# Patient Record
Sex: Female | Born: 1965
Health system: Southern US, Community
[De-identification: ages and names within clinical notes are randomized; demographics above are authoritative.]

---

## 2017-10-16 DIAGNOSIS — Z91038 Other insect allergy status: Secondary | ICD-10-CM | POA: Diagnosis not present

## 2017-12-08 ENCOUNTER — Emergency Department
Admission: EM | Admit: 2017-12-08 | Discharge: 2017-12-08 | Disposition: A | Discharge status: Home / Self Care | Payer: BLUE CROSS/BLUE SHIELD | Attending: Emergency Medicine | Admitting: Emergency Medicine

## 2017-12-08 ENCOUNTER — Encounter: Payer: Self-pay | Admitting: Emergency Medicine

## 2017-12-08 ENCOUNTER — Emergency Department: Payer: BLUE CROSS/BLUE SHIELD

## 2017-12-08 ENCOUNTER — Other Ambulatory Visit: Payer: Self-pay

## 2017-12-08 DIAGNOSIS — M25511 Pain in right shoulder: Secondary | ICD-10-CM | POA: Diagnosis not present

## 2017-12-08 DIAGNOSIS — M75101 Unspecified rotator cuff tear or rupture of right shoulder, not specified as traumatic: Secondary | ICD-10-CM | POA: Diagnosis not present

## 2017-12-08 DIAGNOSIS — R0789 Other chest pain: Secondary | ICD-10-CM | POA: Diagnosis not present

## 2017-12-08 DIAGNOSIS — R079 Chest pain, unspecified: Secondary | ICD-10-CM | POA: Diagnosis not present

## 2017-12-08 LAB — BASIC METABOLIC PANEL WITH GFR
Anion gap: 8 (ref 5–15)
BUN: 18 mg/dL (ref 6–20)
CO2: 25 mmol/L (ref 22–32)
Calcium: 9.2 mg/dL (ref 8.9–10.3)
Chloride: 108 mmol/L (ref 98–111)
Creatinine, Ser: 0.9 mg/dL (ref 0.44–1.00)
GFR calc Af Amer: >60 mL/min
GFR calc non Af Amer: >60 mL/min
Glucose, Bld: 123 mg/dL — ABNORMAL HIGH (ref 70–99)
Potassium: 3.6 mmol/L (ref 3.5–5.1)
Sodium: 141 mmol/L (ref 135–145)

## 2017-12-08 LAB — CBC WITH DIFFERENTIAL/PLATELET
Basophils Absolute: 0 K/uL (ref 0–0.1)
Basophils Relative: 1 %
Eosinophils Absolute: 0.1 K/uL (ref 0–0.7)
Eosinophils Relative: 2 %
HCT: 36.8 % (ref 35.0–47.0)
Hemoglobin: 12.4 g/dL (ref 12.0–16.0)
Lymphocytes Relative: 27 %
Lymphs Abs: 1 K/uL (ref 1.0–3.6)
MCH: 30.1 pg (ref 26.0–34.0)
MCHC: 33.6 g/dL (ref 32.0–36.0)
MCV: 89.7 fL (ref 80.0–100.0)
Monocytes Absolute: 0.4 K/uL (ref 0.2–0.9)
Monocytes Relative: 11 %
Neutro Abs: 2.3 K/uL (ref 1.4–6.5)
Neutrophils Relative %: 59 %
Platelets: 215 K/uL (ref 150–440)
RBC: 4.11 MIL/uL (ref 3.80–5.20)
RDW: 14.1 % (ref 11.5–14.5)
WBC: 3.9 K/uL (ref 3.6–11.0)

## 2017-12-08 LAB — TROPONIN I

## 2017-12-08 MED ORDER — HYDROCODONE-ACETAMINOPHEN 5-325 MG PO TABS: 1.0000 | ORAL_TABLET | ORAL | 0 refills | Status: DC | PRN

## 2017-12-08 MED ORDER — BUPIVACAINE HCL 0.5 % IJ SOLN
3.0000 mL | Freq: Once | INTRAMUSCULAR | Status: AC
  Administered 2017-12-08: 3 mL
  Filled 2017-12-08 (×2): qty 3

## 2017-12-08 MED ORDER — HYDROCODONE-ACETAMINOPHEN 5-325 MG PO TABS
1.0000 | ORAL_TABLET | ORAL | Status: AC
  Administered 2017-12-08: 1 via ORAL
  Filled 2017-12-08: qty 1

## 2017-12-08 MED ORDER — MELOXICAM 15 MG PO TABS: 15.0000 mg | ORAL_TABLET | Freq: Every day | ORAL | 2 refills | Status: AC

## 2017-12-08 MED ORDER — LIDOCAINE HCL (PF) 1 % IJ SOLN
3.0000 mL | Freq: Once | INTRAMUSCULAR | Status: AC
  Administered 2017-12-08: 3 mL
  Filled 2017-12-08: qty 5

## 2017-12-08 MED ORDER — TRIAMCINOLONE ACETONIDE 40 MG/ML IJ SUSP
80.0000 mg | Freq: Once | INTRAMUSCULAR | Status: AC
  Administered 2017-12-08: 80 mg via INTRA_ARTICULAR
  Filled 2017-12-08: qty 2

## 2017-12-08 NOTE — ED Provider Notes (Signed)
[facility] EMERGENCY DEPARTMENT Provider Note   CSN: [id number] Arrival date & time: 12/08/17  2135     History   Chief Complaint Chief Complaint  Patient presents with  . Shoulder Pain    HPI Kendra Reyes is a 52 y.o. female.  Presents to the emergency department for evaluation of right shoulder pain.  Shoulder pain is been present since yesterday morning.  Patient states she woke up Friday morning with aching pain in the right shoulder with no preceding trauma or injury.  She denies any history of shoulder pain.  She has taken ibuprofen 400 mg twice daily over the last 2 days with very mild relief.  Pain is 9 out of 10 increased with shoulder range of motion.  She denies any numbness or tingling right upper extremity.  No pain with neck range of motion.  No chest pain shortness of breath, weakness nausea vomiting diaphoresis.  HPI  History reviewed. No pertinent past medical history.  There are no active problems to display for this patient.   History reviewed. No pertinent surgical history.   OB History   None      Home Medications    Prior to Admission medications   Medication Sig Start Date End Date Taking? Authorizing Provider  HYDROcodone-acetaminophen (NORCO) 5-325 MG tablet Take 1 tablet by mouth every 4 (four) hours as needed for moderate pain. 12/08/17   Evon Slack, PA-C  meloxicam (MOBIC) 15 MG tablet Take 1 tablet (15 mg total) by mouth daily. 12/08/17 12/08/18  Evon Slack, PA-C    Family History No family history on file.  Social History Social History   Tobacco Use  . Smoking status: Never Smoker  . Smokeless tobacco: Never Used  Substance Use Topics  . Alcohol use: Never    Frequency: Never  . Drug use: Never     Allergies   Patient has no known allergies.   Review of Systems Review of Systems  Constitutional: Negative for fever.  Respiratory: Negative for chest tightness.   Cardiovascular:  Negative for chest pain.  Musculoskeletal: Positive for arthralgias. Negative for gait problem, joint swelling, myalgias and neck pain.  Skin: Negative for rash and wound.  Neurological: Negative for dizziness, numbness and headaches.     Physical Exam Updated Vital Signs BP (!) 144/88 (BP Location: Left Arm)   Pulse 61   Temp 98.7 F (37.1 C) (Oral)   Resp 18   Ht 4\' 11"  (1.499 m)   Wt 59 kg (130 lb)   SpO2 100%   BMI 26.26 kg/m   Physical Exam  Constitutional: She is oriented to person, place, and time. She appears well-developed and well-nourished.  HENT:  Head: Normocephalic and atraumatic.  Eyes: Conjunctivae are normal.  Neck: Normal range of motion.  Cardiovascular: Normal rate.  Pulmonary/Chest: Effort normal. No respiratory distress.  Musculoskeletal:  Limited active abduction and flexion of the right shoulder to 70 degrees.  Passively she can be increased to 120.  She has positive Hawkins impingement signs.  She has some pain with internal and external rotation but no limitation in range of motion.  She is tender in the subacromial space proximal biceps tendon.  She is nontender along the elbow.  She has negative Spurling's test bilaterally.  Neurological: She is alert and oriented to person, place, and time.  Skin: Skin is warm. No rash noted.  Psychiatric: She has a normal mood and affect. Her behavior is normal. Thought content normal.  ED Treatments / Results  Labs (all labs ordered are listed, but only abnormal results are displayed) Labs Reviewed  BASIC METABOLIC PANEL - Abnormal; Notable for the following components:      Result Value   Glucose, Bld 123 (*)    All other components within normal limits  CBC WITH DIFFERENTIAL/PLATELET  TROPONIN I    EKG None  Radiology Dg Chest 1 View  Result Date: 12/08/2017 CLINICAL DATA:  Right chest and shoulder pain for 2 days. EXAM: CHEST  1 VIEW COMPARISON:  None. FINDINGS: The heart size and mediastinal  contours are within normal limits. Both lungs are clear. The visualized skeletal structures are unremarkable. IMPRESSION: Negative.  No active disease. Electronically Signed   By: Myles RosenthalJohn  Stahl M.D.   On: 12/08/2017 22:08   Dg Shoulder Right  Result Date: 12/08/2017 CLINICAL DATA:  Right shoulder pain for 2 days. Decreased range of motion. EXAM: RIGHT SHOULDER - 2+ VIEW COMPARISON:  None. FINDINGS: There is no evidence of fracture or dislocation. There is no evidence of arthropathy or other focal bone abnormality. Soft tissue calcification is seen in the distal rotator cuff tendon, consistent with calcific tendinitis. IMPRESSION: Distal rotator cuff calcific tendinitis.  No osseous abnormality. Electronically Signed   By: Myles RosenthalJohn  Stahl M.D.   On: 12/08/2017 22:08    Procedures Injection tendon or ligament Date/Time: 12/08/2017 11:30 PM Performed by: Evon SlackGaines, Thomas C, PA-C Authorized by: Evon SlackGaines, Thomas C, PA-C  Consent: Verbal consent obtained. Consent given by: patient Patient understanding: patient states understanding of the procedure being performed Patient identity confirmed: verbally with patient Preparation: Patient was prepped and draped in the usual sterile fashion. Local anesthesia used: no  Anesthesia: Local anesthesia used: no  Sedation: Patient sedated: no  Patient tolerance: Patient tolerated the procedure well with no immediate complications Comments: Site was marked and prepped with Betadine and then cleansed with alcohol.  Patient was then injected in the posterior subacromial space with a bolus solution of 3 cc of 1% lidocaine, 3 cc of 0.5% bupivacaine and 2 cc of Kenalog 40 mg.  Patient tolerated procedure well.    (including critical care time)  Medications Ordered in ED Medications  lidocaine (PF) (XYLOCAINE) 1 % injection 3 mL (has no administration in time range)  bupivacaine (MARCAINE) 0.5 % (with pres) injection 3 mL (has no administration in time range)    HYDROcodone-acetaminophen (NORCO/VICODIN) 5-325 MG per tablet 1 tablet (has no administration in time range)  triamcinolone acetonide (KENALOG-40) injection 80 mg (80 mg Intra-articular Given 12/08/17 2308)     Initial Impression / Assessment and Plan / ED Course  I have reviewed the triage vital signs and the nursing notes.  Pertinent labs & imaging results that were available during my care of the patient were reviewed by me and considered in my medical decision making (see chart for details).     52 year old female with right shoulder pain consistent with rotator cuff syndrome.  No trauma or injury.  Patient with cardiac work-up that was negative.  Chest x-ray and shoulder x-rays reviewed by me today show no acute process, mild rotator cuff calcifications but no significant arthropathy.  Patient agreed and consented to a right shoulder subacromial bursa injection with cortisone today in the emergency department.  She tolerated procedure well and saw some improvement after the procedure.  She was given Norco to help with pain and she was sent home with Norco and meloxicam.  If no improvement in 1 week she will follow  with orthopedics.  Final Clinical Impressions(s) / ED Diagnoses   Final diagnoses:  Acute pain of right shoulder  Rotator cuff syndrome, right    ED Discharge Orders        Ordered    HYDROcodone-acetaminophen (NORCO) 5-325 MG tablet  Every 4 hours PRN     12/08/17 2308    meloxicam (MOBIC) 15 MG tablet  Daily     12/08/17 2308       Ronnette Juniper 12/08/17 2335    Minna Antis, MD 12/10/17 [phone]

## 2017-12-08 NOTE — ED Notes (Signed)
Pt states that her right shoulder has been sore since Friday morning. and now she can barely lift it. She also states numbness and throbbing in that arm. Pt alert and oriented x 4 Family at bedside.

## 2017-12-08 NOTE — Discharge Instructions (Signed)
Please take Mobic daily with food.  You may use Norco as needed for severe pain.  Please work on gentle shoulder range of motion exercises.  If no improvement 1 week follow-up with orthopedist.  Return to ER for any worsening symptoms or urgent changes in health.

## 2017-12-08 NOTE — ED Triage Notes (Signed)
Pt reports waking up with right shoulder pain x 2 days ago. Pt reports no chest pain at this time but is c/o right arm numbness. Pt is ambulatory to triage and in NAD.

## 2018-03-04 DIAGNOSIS — Z130 Encounter for screening for diseases of the blood and blood-forming organs and certain disorders involving the immune mechanism: Secondary | ICD-10-CM | POA: Diagnosis not present

## 2018-03-04 DIAGNOSIS — Z131 Encounter for screening for diabetes mellitus: Secondary | ICD-10-CM | POA: Diagnosis not present

## 2018-03-04 DIAGNOSIS — Z01419 Encounter for gynecological examination (general) (routine) without abnormal findings: Secondary | ICD-10-CM | POA: Diagnosis not present

## 2018-03-04 DIAGNOSIS — Z0000 Encounter for general adult medical examination without abnormal findings: Secondary | ICD-10-CM | POA: Diagnosis not present

## 2018-03-04 DIAGNOSIS — Z6825 Body mass index (BMI) 25.0-25.9, adult: Secondary | ICD-10-CM | POA: Diagnosis not present

## 2018-03-04 DIAGNOSIS — Z1329 Encounter for screening for other suspected endocrine disorder: Secondary | ICD-10-CM | POA: Diagnosis not present

## 2018-03-04 DIAGNOSIS — Z13220 Encounter for screening for lipoid disorders: Secondary | ICD-10-CM | POA: Diagnosis not present

## 2018-03-04 DIAGNOSIS — Z1231 Encounter for screening mammogram for malignant neoplasm of breast: Secondary | ICD-10-CM | POA: Diagnosis not present

## 2018-03-04 DIAGNOSIS — Z1151 Encounter for screening for human papillomavirus (HPV): Secondary | ICD-10-CM | POA: Diagnosis not present

## 2018-03-13 DIAGNOSIS — Z1211 Encounter for screening for malignant neoplasm of colon: Secondary | ICD-10-CM | POA: Diagnosis not present

## 2018-03-13 DIAGNOSIS — Z1212 Encounter for screening for malignant neoplasm of rectum: Secondary | ICD-10-CM | POA: Diagnosis not present

## 2018-03-14 DIAGNOSIS — D251 Intramural leiomyoma of uterus: Secondary | ICD-10-CM | POA: Diagnosis not present

## 2018-11-29 DIAGNOSIS — H1045 Other chronic allergic conjunctivitis: Secondary | ICD-10-CM | POA: Diagnosis not present

## 2019-03-31 IMAGING — CR DG CHEST 1V
1 series · 1 of 1 positions shown · non-contrast
Comparison: None.

CLINICAL DATA: Right chest and shoulder pain for 2 days.

EXAM:
CHEST  1 VIEW

[dg chest 1 view]
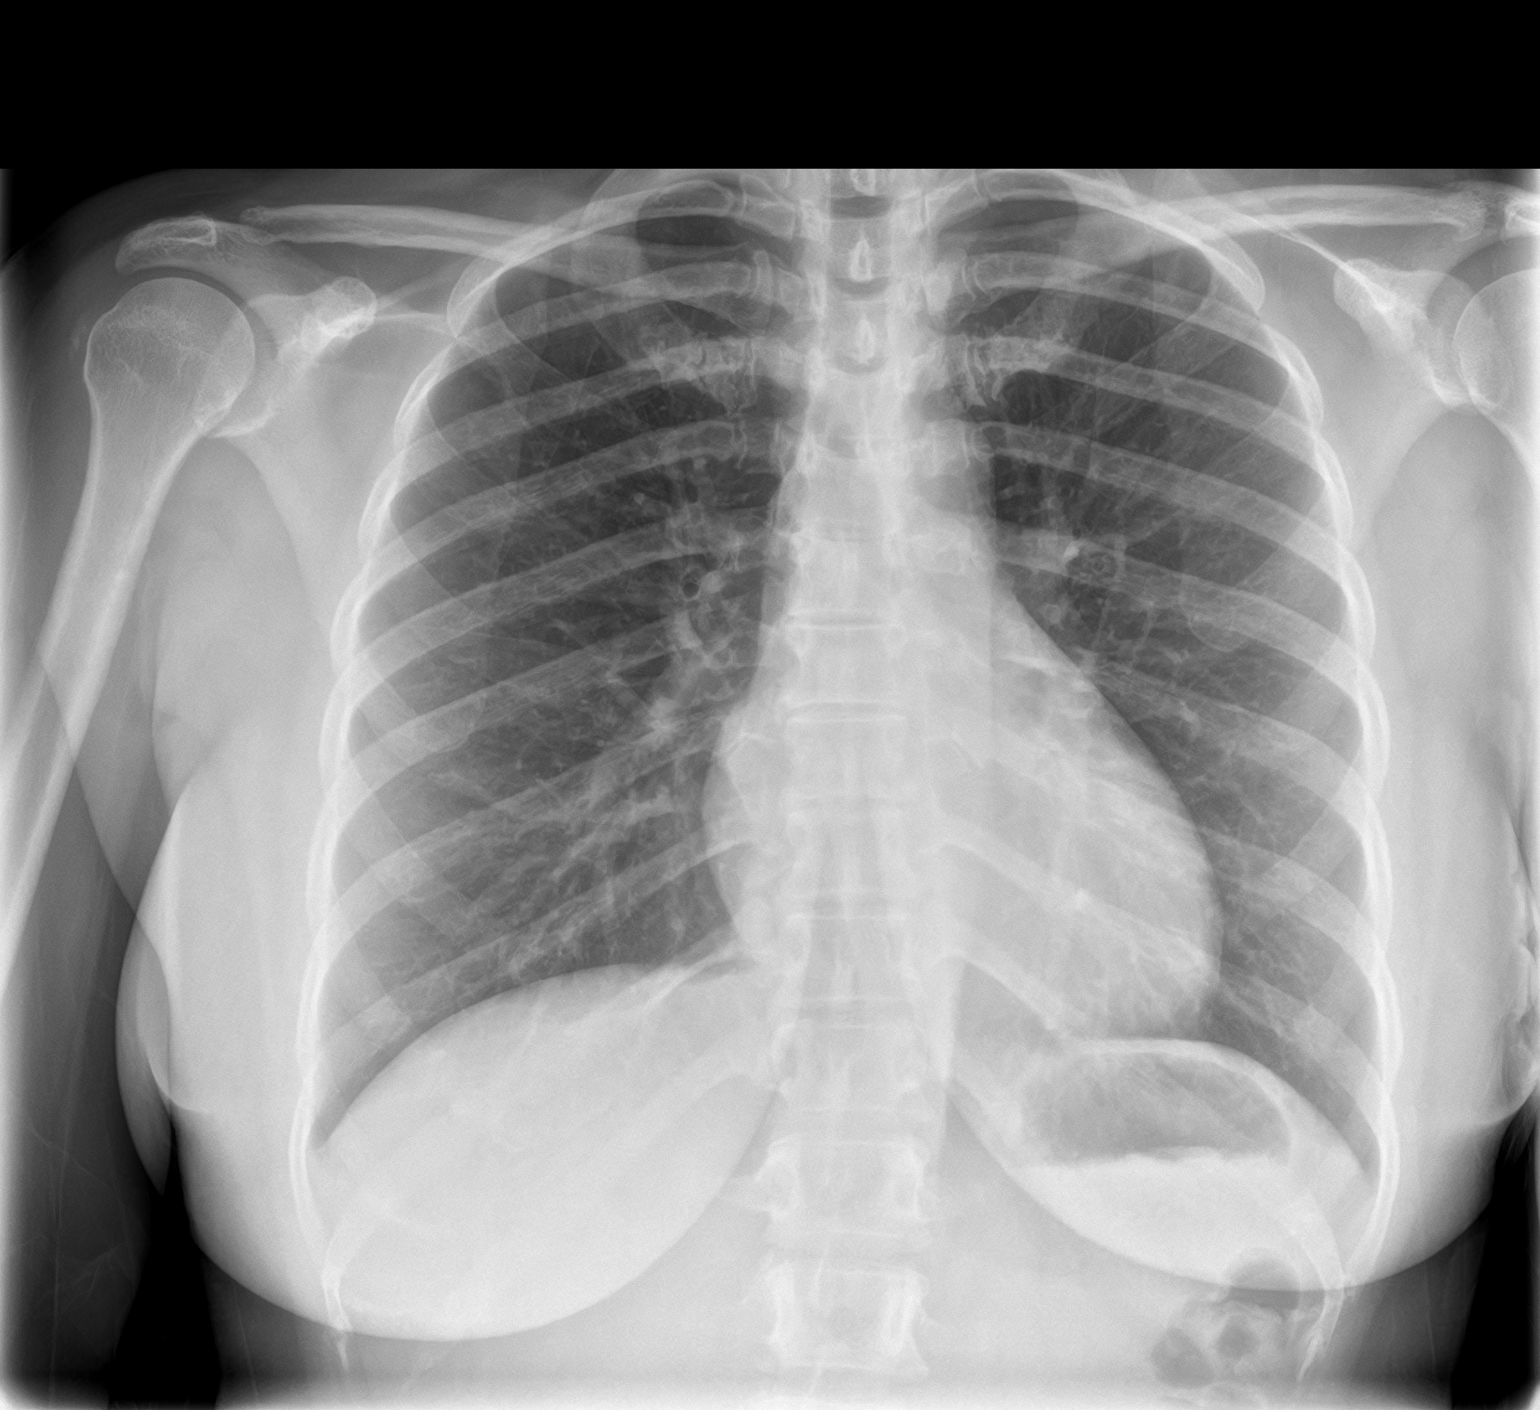

[1 of 1 positions shown; findings below may reference images not displayed]

FINDINGS: The heart size and mediastinal contours are within normal limits.
Both lungs are clear. The visualized skeletal structures are
unremarkable.
IMPRESSION: Negative.  No active disease.

## 2019-04-22 DIAGNOSIS — M65332 Trigger finger, left middle finger: Secondary | ICD-10-CM | POA: Diagnosis not present

## 2019-04-22 DIAGNOSIS — R200 Anesthesia of skin: Secondary | ICD-10-CM | POA: Diagnosis not present

## 2019-04-22 DIAGNOSIS — M65312 Trigger thumb, left thumb: Secondary | ICD-10-CM | POA: Diagnosis not present

## 2019-05-27 DIAGNOSIS — Z01419 Encounter for gynecological examination (general) (routine) without abnormal findings: Secondary | ICD-10-CM | POA: Diagnosis not present

## 2019-05-27 DIAGNOSIS — Z6826 Body mass index (BMI) 26.0-26.9, adult: Secondary | ICD-10-CM | POA: Diagnosis not present

## 2019-05-27 DIAGNOSIS — Z1151 Encounter for screening for human papillomavirus (HPV): Secondary | ICD-10-CM | POA: Diagnosis not present

## 2019-05-30 DIAGNOSIS — Z1231 Encounter for screening mammogram for malignant neoplasm of breast: Secondary | ICD-10-CM | POA: Diagnosis not present

## 2019-06-25 ENCOUNTER — Other Ambulatory Visit: Payer: Self-pay

## 2019-06-25 ENCOUNTER — Encounter (HOSPITAL_COMMUNITY): Payer: Self-pay | Admitting: Emergency Medicine

## 2019-06-25 ENCOUNTER — Ambulatory Visit (HOSPITAL_COMMUNITY)
Admission: EM | Admit: 2019-06-25 | Discharge: 2019-06-25 | Disposition: A | Discharge status: Home / Self Care | Payer: BC Managed Care – PPO | Attending: Family Medicine | Admitting: Family Medicine

## 2019-06-25 DIAGNOSIS — M79622 Pain in left upper arm: Secondary | ICD-10-CM
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
## 2019-06-25 NOTE — Discharge Instructions (Signed)
If not allergic, you may use over the counter ibuprofen or acetaminophen as needed. ° °
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
## 2022-01-05 DIAGNOSIS — Z1211 Encounter for screening for malignant neoplasm of colon: Secondary | ICD-10-CM | POA: Diagnosis not present

## 2022-01-05 DIAGNOSIS — K5900 Constipation, unspecified: Secondary | ICD-10-CM | POA: Diagnosis not present

## 2022-02-13 DIAGNOSIS — Z1211 Encounter for screening for malignant neoplasm of colon: Secondary | ICD-10-CM | POA: Diagnosis not present

## 2022-12-25 DIAGNOSIS — R350 Frequency of micturition: Secondary | ICD-10-CM | POA: Diagnosis not present

## 2022-12-25 DIAGNOSIS — R3915 Urgency of urination: Secondary | ICD-10-CM | POA: Diagnosis not present

## 2023-01-08 DIAGNOSIS — N390 Urinary tract infection, site not specified: Secondary | ICD-10-CM | POA: Diagnosis not present

## 2023-07-20 DIAGNOSIS — Z1231 Encounter for screening mammogram for malignant neoplasm of breast: Secondary | ICD-10-CM | POA: Diagnosis not present

## 2023-11-11 DIAGNOSIS — L309 Dermatitis, unspecified: Secondary | ICD-10-CM | POA: Diagnosis not present

## 2023-12-03 DIAGNOSIS — Z13228 Encounter for screening for other metabolic disorders: Secondary | ICD-10-CM | POA: Diagnosis not present

## 2023-12-03 DIAGNOSIS — Z130 Encounter for screening for diseases of the blood and blood-forming organs and certain disorders involving the immune mechanism: Secondary | ICD-10-CM | POA: Diagnosis not present

## 2023-12-03 DIAGNOSIS — Z0000 Encounter for general adult medical examination without abnormal findings: Secondary | ICD-10-CM | POA: Diagnosis not present

## 2023-12-03 DIAGNOSIS — Z23 Encounter for immunization: Secondary | ICD-10-CM | POA: Diagnosis not present

## 2023-12-03 DIAGNOSIS — E559 Vitamin D deficiency, unspecified: Secondary | ICD-10-CM | POA: Diagnosis not present

## 2023-12-03 DIAGNOSIS — Z13220 Encounter for screening for lipoid disorders: Secondary | ICD-10-CM | POA: Diagnosis not present

## 2023-12-06 ENCOUNTER — Other Ambulatory Visit (HOSPITAL_BASED_OUTPATIENT_CLINIC_OR_DEPARTMENT_OTHER): Payer: Self-pay | Admitting: Family Medicine

## 2023-12-06 DIAGNOSIS — E7800 Pure hypercholesterolemia, unspecified: Secondary | ICD-10-CM

## 2023-12-18 ENCOUNTER — Ambulatory Visit
Admission: RE | Admit: 2023-12-18 | Discharge: 2023-12-18 | Disposition: A | Discharge status: Home / Self Care | Payer: Self-pay | Source: Ambulatory Visit | Attending: Family Medicine | Admitting: Family Medicine

## 2023-12-18 DIAGNOSIS — E7800 Pure hypercholesterolemia, unspecified: Secondary | ICD-10-CM | POA: Insufficient documentation

## 2024-01-09 ENCOUNTER — Ambulatory Visit: Admitting: General Practice

## 2024-02-21 DIAGNOSIS — D72819 Decreased white blood cell count, unspecified: Secondary | ICD-10-CM | POA: Diagnosis not present

## 2024-03-25 ENCOUNTER — Inpatient Hospital Stay: Payer: Self-pay | Admitting: Hematology

## 2024-03-25 ENCOUNTER — Inpatient Hospital Stay: Payer: Self-pay

## 2024-03-30 NOTE — Progress Notes (Unsigned)
 [facility] [phone]   [phone]  CONSULT NOTE  REFERRING PHYSICIAN: Dr. Dyane  REASON FOR CONSULTATION:  Leukopenia  HPI Kendra Reyes is a 58 y.o. female with a past medical history significant for high cholesterol is referred to the clinic for leukopenia.    She was referred by her PCP.  She establish care with a new PCP in July 2025.  At that time her white blood cell count was low at 2.9, normal hemoglobin at 13.5, normal platelet count at 2 18K.  They recommended a follow-up lab check in 3 months.  She then had labs performed on 10/2 that showed total white blood cell count low at 2.4, normal hemoglobin at 12.7, normal platelet count at 227K.  Her ANC was 1.1.  She was referred to the clinic regarding these findings.  The patient does have a long history of leukopenia/neutropenia dating back at least 15 years.  She states that at that time she was feeling more tired and fatigued and did see hematologist.  She reports her lab test were normal and it was recommended to monitor her closely and have her report any unusual symptoms such as worsening fatigue.  The patient was busy with 5 children and she noticed that she was more fatigued when she was active.  Therefore she is trying to be more conscientious about resting when needed.  She reports no significant fatigue at this time and she is still very busy.  She does not frequently.  She was last sick with COVID in 2020.  She denies any known vitamin deficiencies except vitamin D deficiency for which she takes a vitamin D supplement.  She also takes zinc.  Otherwise she does not take any other over-the-counter medications, prescription medications, herbal supplements.  She denies any dietary restrictions and has a well-balanced diet.  He does get hot flashes.  She was told that her estrogen is low.  She does not have any chronic kidney disease.  She denies any exposure to HIV or  hepatitis.  Denies any tickborne illness.  Denies any fever, chills, or unexplained weight loss.  Denies any known liver disease.  Denies any alcohol consumption.  Denies any thyroid  or inflammatory disorders.  Denies any personal history of autoimmune disorders.  Her mother had history of stomach cancer although she had been having ongoing issues with her stomach for some time and unclear if this is related to gastritis that contributed to her diagnosis of stomach cancer.  The patient has a sister who had breast cancer.  The patient is retired.  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  She has 5 children.  She is married.  Denies any drug, alcohol, or smoking.      HPI  No past medical history on file.  No past surgical history on file.  Family History  Problem Relation Age of Onset   Healthy Father     Social History Social History   Tobacco Use   Smoking status: Never   Smokeless tobacco: Never  Substance Use Topics   Alcohol use: Never   Drug use: Never    No Known Allergies  No current outpatient medications on file.   No current facility-administered medications for this visit.    REVIEW OF SYSTEMS:   Review of Systems  Constitutional: Negative for appetite change, chills, fatigue, fever and unexpected weight change.  HENT:   Negative for mouth  sores, nosebleeds, sore throat and trouble swallowing.   Eyes: Negative for eye problems and icterus.  Respiratory: Negative for cough, hemoptysis, shortness of breath and wheezing.   Cardiovascular: Negative for chest pain and leg swelling.  Gastrointestinal: Negative for abdominal pain, constipation, diarrhea, nausea and vomiting.  Genitourinary: Negative for bladder incontinence, difficulty urinating, dysuria, frequency and hematuria.   Musculoskeletal: Negative for back pain, gait problem, neck pain and neck stiffness.  Skin: Negative for itching and rash.   Neurological: Negative for dizziness, extremity weakness, gait problem, headaches, light-headedness and seizures.  Hematological: Negative for adenopathy. Does not bruise/bleed easily.  Psychiatric/Behavioral: Negative for confusion, depression and sleep disturbance. The patient is not nervous/anxious.     PHYSICAL EXAMINATION:  There were no vitals taken for this visit.  ECOG PERFORMANCE STATUS: 0  Physical Exam  Constitutional: Oriented to person, place, and time and well-developed, well-nourished, and in no distress.   HENT:  Head: Normocephalic and atraumatic.  Mouth/Throat: Oropharynx is clear and moist. No oropharyngeal exudate.  Eyes: Conjunctivae are normal. Right eye exhibits no discharge. Left eye exhibits no discharge. No scleral icterus.  Neck: Normal range of motion. Neck supple.  Cardiovascular: Normal rate, regular rhythm, normal heart sounds and intact distal pulses.   Pulmonary/Chest: Effort normal and breath sounds normal. No respiratory distress. No wheezes. No rales.  Abdominal: Soft. Bowel sounds are normal. Exhibits no distension and no mass. There is no tenderness.  Musculoskeletal: Normal range of motion. Exhibits no edema.  Lymphadenopathy:    No cervical adenopathy.  Neurological: Alert and oriented to person, place, and time. Exhibits normal muscle tone. Gait normal. Coordination normal.  Skin: Skin is warm and dry. No rash noted. Not diaphoretic. No erythema. No pallor.  Psychiatric: Mood, memory and judgment normal.  Vitals reviewed.  LABORATORY DATA: Lab Results  Component Value Date   WBC 3.9 12/08/2017   HGB 12.4 12/08/2017   HCT 36.8 12/08/2017   MCV 89.7 12/08/2017   PLT 215 12/08/2017      Chemistry      Component Value Date/Time   NA 141 12/08/2017 2150   K 3.6 12/08/2017 2150   CL 108 12/08/2017 2150   CO2 25 12/08/2017 2150   BUN 18 12/08/2017 2150   CREATININE 0.90 12/08/2017 2150      Component Value Date/Time   CALCIUM 9.2  12/08/2017 2150       RADIOGRAPHIC STUDIES: No results found.  ASSESSMENT: This is a very pleasant 58 year old female who was referred to the clinic for leukopenia.    The patient was seen with Dr. Sherrod. We are arranging for B12, CBC, CMP, folate, B12, hepatitis, and HIV testing.  We also will arrange for RF and ANA.   Her white blood cell count today is 2.7, and her ANC is 1.5.  CMP is unremarkable.  Her other labs are pending.  We will see her back in 2 months. If she has leukopenia at that time that is persistent or worsening, we may recommend bone marrow biopsy and aspirate per Dr. Sherrod just to rule out bone marrow pathology.  Recommend observation with close monitoring at this time.  I will call her with the summary of her labs that were drawn today.  If any of them are abnormal I will call her sooner and relay any instructions.  The patient voices understanding of current disease status and treatment options and is in agreement with the current care plan.  All questions were answered. The patient  knows to call the clinic with any problems, questions or concerns. We can certainly see the patient much sooner if necessary.  Thank you so much for allowing me to participate in the care of Kendra Reyes. I will continue to follow up the patient with you and assist in her care.   Disclaimer: This note was dictated with voice recognition software. Similar sounding words can inadvertently be transcribed and may not be corrected upon review.   Lallie Strahm L Lydiana Milley March 30, 2024, 11:23 AM  ADDENDUM: Hematology/Oncology Attending:  I had a face-to-face encounter with the patient today.  I reviewed her records, labs and recommended her care plan.  This is a very pleasant 58 years old female who was seen recently by her primary care provider to establish care in July 2025 and blood work at that time including CBC showed decreased white blood count of 2.9 with  normal hemoglobin and hematocrit as well as normal platelets count.  She had repeat blood work again 3 months later on February 21, 2024 that showed persistent leukocytopenia with total white blood count of 2.4.  The patient ANC was 1100.  She has normal hemoglobin and hematocrit as well as platelets count.  She was referred to us  today for evaluation and recommendation regarding her persistent leukocytopenia.  The patient mentioned that she also had leukocytopenia 15 years ago when she was in [place]  and she was evaluated at that time but her white blood count recovered to the normal range and she was advised to continue monitoring.  She did not have any bone marrow biopsy and aspirate in the past.  She does not take a lot of prescription medication except for vitamin D supplement and zinc. I had a lengthy discussion with the patient today about her current condition and further investigation to identify the etiology of her leukocytopenia.  We ordered several studies today including repeat CBC which showed total white blood count of 2.7 with absolute neutrophil count of 1500.  She has normal hemoglobin of 12.9 and hematocrit 39.5 and platelets count 220,000.  Comprehensive metabolic panel is unremarkable.  She has normal serum folate and vitamin B12 levels.  She also has normal acute hepatitis panel and HIV.  Her LDH is normal.  Rheumatoid factor and ANA are still pending. I recommended for the patient to continue on observation for now.  If the pending lab results showed no etiology for her leukocytopenia and it persisted on the upcoming blood work in 3 months, we may consider The patient for a bone marrow biopsy and aspirate to rule out any underlying bone marrow abnormality. The patient is in agreement with the current plan. She will come back for follow-up visit in 3 months for evaluation and repeat blood work. Disclaimer: This note was dictated with voice recognition software. Similar sounding words can  inadvertently be transcribed and may be missed upon review. Sherrod MARLA Sherrod, MD

## 2024-03-31 ENCOUNTER — Telehealth: Payer: Self-pay

## 2024-03-31 NOTE — Telephone Encounter (Signed)
 Tried to reach Anthony Hint, NP @ [facility] in regards to recent referral sent to our office.  Left a message with Candace in medical records to please fax over most recent labs and office notes.  Provided fax number in VM and asked for a return call with any questions.

## 2024-04-02 ENCOUNTER — Inpatient Hospital Stay: Payer: Self-pay

## 2024-04-02 ENCOUNTER — Other Ambulatory Visit: Payer: Self-pay | Admitting: Physician Assistant

## 2024-04-02 ENCOUNTER — Inpatient Hospital Stay: Payer: Self-pay | Attending: Physician Assistant | Admitting: Physician Assistant

## 2024-04-02 VITALS — BP 145/85 | HR 81 | Temp 98.0°F | Resp 17 | Ht 59.0 in | Wt 134.2 lb

## 2024-04-02 DIAGNOSIS — E7800 Pure hypercholesterolemia, unspecified: Secondary | ICD-10-CM | POA: Insufficient documentation

## 2024-04-02 DIAGNOSIS — E559 Vitamin D deficiency, unspecified: Secondary | ICD-10-CM | POA: Insufficient documentation

## 2024-04-02 DIAGNOSIS — Z8616 Personal history of COVID-19: Secondary | ICD-10-CM | POA: Insufficient documentation

## 2024-04-02 DIAGNOSIS — Z803 Family history of malignant neoplasm of breast: Secondary | ICD-10-CM | POA: Insufficient documentation

## 2024-04-02 DIAGNOSIS — D72819 Decreased white blood cell count, unspecified: Secondary | ICD-10-CM | POA: Insufficient documentation

## 2024-04-02 DIAGNOSIS — R232 Flushing: Secondary | ICD-10-CM | POA: Insufficient documentation

## 2024-04-02 LAB — CMP (CANCER CENTER ONLY)
ALT: 10 U/L (ref 0–44)
AST: 19 U/L (ref 15–41)
Albumin: 4.5 g/dL (ref 3.5–5.0)
Alkaline Phosphatase: 58 U/L (ref 38–126)
Anion gap: 7 (ref 5–15)
BUN: 16 mg/dL (ref 6–20)
CO2: 27 mmol/L (ref 22–32)
Calcium: 9.8 mg/dL (ref 8.9–10.3)
Chloride: 103 mmol/L (ref 98–111)
Creatinine: 0.85 mg/dL (ref 0.44–1.00)
GFR, Estimated: >60 mL/min (ref >60)
Glucose, Bld: 83 mg/dL (ref 70–99)
Potassium: 3.6 mmol/L (ref 3.5–5.1)
Sodium: 137 mmol/L (ref 135–145)
Total Bilirubin: 0.5 mg/dL (ref 0.0–1.2)
Total Protein: 7.9 g/dL (ref 6.5–8.1)

## 2024-04-02 LAB — HIV ANTIBODY (ROUTINE TESTING W REFLEX): HIV Screen 4th Generation wRfx: NONREACTIVE

## 2024-04-02 LAB — CBC WITH DIFFERENTIAL (CANCER CENTER ONLY)
Abs Immature Granulocytes: 0.01 K/uL (ref 0.00–0.07)
Basophils Absolute: 0 K/uL (ref 0.0–0.1)
Basophils Relative: 1 %
Eosinophils Absolute: 0 K/uL (ref 0.0–0.5)
Eosinophils Relative: 2 %
HCT: 39.5 % (ref 36.0–46.0)
Hemoglobin: 12.9 g/dL (ref 12.0–15.0)
Immature Granulocytes: 0 %
Lymphocytes Relative: 33 %
Lymphs Abs: 0.9 K/uL (ref 0.7–4.0)
MCH: 29.4 pg (ref 26.0–34.0)
MCHC: 32.7 g/dL (ref 30.0–36.0)
MCV: 90 fL (ref 80.0–100.0)
Monocytes Absolute: 0.2 K/uL (ref 0.1–1.0)
Monocytes Relative: 7 %
Neutro Abs: 1.5 K/uL — ABNORMAL LOW (ref 1.7–7.7)
Neutrophils Relative %: 57 %
Platelet Count: 220 K/uL (ref 150–400)
RBC: 4.39 MIL/uL (ref 3.87–5.11)
RDW: 13.2 % (ref 11.5–15.5)
WBC Count: 2.7 K/uL — ABNORMAL LOW (ref 4.0–10.5)
nRBC: 0 % (ref 0.0–0.2)

## 2024-04-02 LAB — HEPATITIS PANEL, ACUTE
HCV Ab: NONREACTIVE
Hep A IgM: NONREACTIVE
Hep B C IgM: NONREACTIVE
Hepatitis B Surface Ag: NONREACTIVE

## 2024-04-02 LAB — VITAMIN B12: Vitamin B-12: 479 pg/mL (ref 180–914)

## 2024-04-02 LAB — LACTATE DEHYDROGENASE: LDH: 177 U/L (ref 105–235)

## 2024-04-02 LAB — FOLATE: Folate: 10.9 ng/mL (ref >5.9)

## 2024-04-03 LAB — RHEUMATOID FACTOR: Rheumatoid fact SerPl-aCnc: <10 [IU]/mL (ref <14.0)

## 2024-04-04 ENCOUNTER — Encounter: Payer: Self-pay | Admitting: Family Medicine

## 2024-04-04 LAB — FANA STAINING PATTERNS: Speckled Pattern: 24529

## 2024-04-04 LAB — ANTINUCLEAR ANTIBODIES, IFA: ANA Ab, IFA: POSITIVE — AB

## 2024-04-11 ENCOUNTER — Telehealth: Payer: Self-pay | Admitting: Physician Assistant

## 2024-04-11 NOTE — Telephone Encounter (Signed)
 I called her to review her labs from last week. We will recheck her labs as scheduled in January 2026

## 2024-05-30 ENCOUNTER — Other Ambulatory Visit: Payer: Self-pay

## 2024-05-30 DIAGNOSIS — D72819 Decreased white blood cell count, unspecified: Secondary | ICD-10-CM

## 2024-06-02 ENCOUNTER — Inpatient Hospital Stay: Payer: Self-pay | Admitting: Internal Medicine

## 2024-06-02 ENCOUNTER — Inpatient Hospital Stay: Payer: Self-pay | Attending: Physician Assistant

## 2024-06-02 VITALS — BP 154/87 | HR 85 | Temp 97.8°F | Resp 17 | Ht 59.0 in | Wt 136.0 lb

## 2024-06-02 DIAGNOSIS — D72819 Decreased white blood cell count, unspecified: Secondary | ICD-10-CM | POA: Diagnosis present

## 2024-06-02 DIAGNOSIS — R7689 Other specified abnormal immunological findings in serum: Secondary | ICD-10-CM | POA: Diagnosis not present

## 2024-06-02 DIAGNOSIS — D709 Neutropenia, unspecified: Secondary | ICD-10-CM | POA: Diagnosis not present

## 2024-06-02 LAB — CBC WITH DIFFERENTIAL (CANCER CENTER ONLY)
Abs Immature Granulocytes: 0 K/uL (ref 0.00–0.07)
Basophils Absolute: 0 K/uL (ref 0.0–0.1)
Basophils Relative: 1 %
Eosinophils Absolute: 0.1 K/uL (ref 0.0–0.5)
Eosinophils Relative: 3 %
HCT: 39.5 % (ref 36.0–46.0)
Hemoglobin: 13 g/dL (ref 12.0–15.0)
Immature Granulocytes: 0 %
Lymphocytes Relative: 41 %
Lymphs Abs: 1.2 K/uL (ref 0.7–4.0)
MCH: 29.3 pg (ref 26.0–34.0)
MCHC: 32.9 g/dL (ref 30.0–36.0)
MCV: 89.2 fL (ref 80.0–100.0)
Monocytes Absolute: 0.3 K/uL (ref 0.1–1.0)
Monocytes Relative: 11 %
Neutro Abs: 1.3 K/uL — ABNORMAL LOW (ref 1.7–7.7)
Neutrophils Relative %: 44 %
Platelet Count: 242 K/uL (ref 150–400)
RBC: 4.43 MIL/uL (ref 3.87–5.11)
RDW: 13.2 % (ref 11.5–15.5)
WBC Count: 2.9 K/uL — ABNORMAL LOW (ref 4.0–10.5)
nRBC: 0 % (ref 0.0–0.2)

## 2024-06-02 NOTE — Progress Notes (Signed)
 "     [facility] [phone]   [phone]  OFFICE PROGRESS NOTE  Kendra Anthony RAMAN, FNP [address] [place] [address]  DIAGNOSIS: Chronic leukocytopenia of unclear etiology.  PRIOR THERAPY: None  CURRENT THERAPY: Observation  INTERVAL HISTORY: Kendra Reyes 59 y.o. female returns to the clinic today for follow-up visit. Discussed the use of AI scribe software for clinical note transcription with the patient, who gave verbal consent to proceed.  History of Present Illness Kendra Reyes is a 59 year old female with chronic leukopenia who presents for routine hematologic evaluation and repeat blood work.  The patient has a history of chronic leukopenia with persistently decreased white blood cell counts, most recently 2.9 x10^3/L, and an absolute neutrophil count of 1300/L. Extensive prior evaluation has included iron studies, vitamin B12, folic acid , rheumatoid factor, hepatitis panel, and antinuclear antibody testing. ANA testing was performed, and further analysis did not reveal any significant findings.  She remains asymptomatic, denying infections, fever, abnormal bleeding, bruising, lymphadenopathy, fatigue, weight loss, chills, or night sweats. She maintains an active lifestyle, regularly running and exercising, and reports good energy and overall well-being.     MEDICAL HISTORY:No past medical history on file.  ALLERGIES:  has no known allergies.  MEDICATIONS:  No current outpatient medications on file.   No current facility-administered medications for this visit.    SURGICAL HISTORY: No past surgical history on file.  REVIEW OF SYSTEMS:  A comprehensive review of systems was negative.   PHYSICAL EXAMINATION: General appearance: alert, cooperative, and no distress Head: Normocephalic, without obvious abnormality, atraumatic Neck: no adenopathy, no JVD, supple, symmetrical, trachea midline,  and thyroid  not enlarged, symmetric, no tenderness/mass/nodules Lymph nodes: Cervical, supraclavicular, and axillary nodes normal. Resp: clear to auscultation bilaterally Back: symmetric, no curvature. ROM normal. No CVA tenderness. Cardio: regular rate and rhythm, S1, S2 normal, no murmur, click, rub or gallop GI: soft, non-tender; bowel sounds normal; no masses,  no organomegaly Extremities: extremities normal, atraumatic, no cyanosis or edema  ECOG PERFORMANCE STATUS: 0 - Asymptomatic  Blood pressure (!) 154/87, pulse 85, temperature 97.8 F (36.6 C), temperature source Temporal, resp. rate 17, height 4' 11 (1.499 m), weight 136 lb (61.7 kg), SpO2 100%.  LABORATORY DATA: Lab Results  Component Value Date   WBC 2.9 (L) 06/02/2024   HGB 13.0 06/02/2024   HCT 39.5 06/02/2024   MCV 89.2 06/02/2024   PLT 242 06/02/2024      Chemistry      Component Value Date/Time   NA 137 04/02/2024 1100   K 3.6 04/02/2024 1100   CL 103 04/02/2024 1100   CO2 27 04/02/2024 1100   BUN 16 04/02/2024 1100   CREATININE 0.85 04/02/2024 1100      Component Value Date/Time   CALCIUM 9.8 04/02/2024 1100   ALKPHOS 58 04/02/2024 1100   AST 19 04/02/2024 1100   ALT 10 04/02/2024 1100   BILITOT 0.5 04/02/2024 1100       RADIOGRAPHIC STUDIES: No results found.  ASSESSMENT AND PLAN: This is a very pleasant 59 years old female with persistent and chronic leukocytopenia that has been going on for more than 15 years.  The etiology is unclear.  She had extensive workup that was unremarkable except for positive ANA but likely nonspecific. Assessment and Plan Assessment & Plan Chronic leukopenia Longstanding leukopenia with persistently decreased white blood cell counts for approximately fifteen years. Extensive prior evaluation, including iron studies, vitamin B12, folic  acid, rheumatoid factor, hepatitis panel, and ANA, has not identified an etiology. She remains asymptomatic, physically active, and  has not experienced recurrent or severe infections. Current laboratory values are unchanged with no evidence of progression or new complications. - Reviewed recent CBC and serologies. - No intervention indicated at this time. - Recommended repeat blood work in six months for surveillance. - Advised to contact the office if she develops a severe infection or significant decline in leukocyte counts.  The patient was advised to call immediately if she has any other concerning symptoms in the interval. The patient voices understanding of current disease status and treatment options and is in agreement with the current care plan.  All questions were answered. The patient knows to call the clinic with any problems, questions or concerns. We can certainly see the patient much sooner if necessary. The total time spent in the appointment was 20 minutes including review of chart and various tests results, discussions about plan of care and coordination of care plan .   Disclaimer: This note was dictated with voice recognition software. Similar sounding words can inadvertently be transcribed and may not be corrected upon review.        "

## 2024-12-01 ENCOUNTER — Inpatient Hospital Stay: Admitting: Internal Medicine

## 2024-12-01 ENCOUNTER — Inpatient Hospital Stay
